# Patient Record
Sex: Male | Born: 2000
Health system: Southern US, Community
[De-identification: ages and names within clinical notes are randomized; demographics above are authoritative.]

## PROBLEM LIST (undated history)

## (undated) DIAGNOSIS — L700 Acne vulgaris: Secondary | ICD-10-CM

## (undated) HISTORY — DX: Acne vulgaris: L70.0

---
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
## 2022-04-17 DIAGNOSIS — M9905 Segmental and somatic dysfunction of pelvic region: Secondary | ICD-10-CM | POA: Diagnosis not present

## 2022-04-17 DIAGNOSIS — M9902 Segmental and somatic dysfunction of thoracic region: Secondary | ICD-10-CM | POA: Diagnosis not present

## 2022-04-17 DIAGNOSIS — M9901 Segmental and somatic dysfunction of cervical region: Secondary | ICD-10-CM | POA: Diagnosis not present

## 2022-04-17 DIAGNOSIS — M9903 Segmental and somatic dysfunction of lumbar region: Secondary | ICD-10-CM | POA: Diagnosis not present

## 2022-04-18 DIAGNOSIS — M9903 Segmental and somatic dysfunction of lumbar region: Secondary | ICD-10-CM | POA: Diagnosis not present

## 2022-04-18 DIAGNOSIS — M9902 Segmental and somatic dysfunction of thoracic region: Secondary | ICD-10-CM | POA: Diagnosis not present

## 2022-04-18 DIAGNOSIS — M9901 Segmental and somatic dysfunction of cervical region: Secondary | ICD-10-CM | POA: Diagnosis not present

## 2022-04-18 DIAGNOSIS — M9905 Segmental and somatic dysfunction of pelvic region: Secondary | ICD-10-CM | POA: Diagnosis not present

## 2022-04-20 DIAGNOSIS — M9902 Segmental and somatic dysfunction of thoracic region: Secondary | ICD-10-CM | POA: Diagnosis not present

## 2022-04-20 DIAGNOSIS — M9903 Segmental and somatic dysfunction of lumbar region: Secondary | ICD-10-CM | POA: Diagnosis not present

## 2022-04-20 DIAGNOSIS — M9901 Segmental and somatic dysfunction of cervical region: Secondary | ICD-10-CM | POA: Diagnosis not present

## 2022-04-20 DIAGNOSIS — M9905 Segmental and somatic dysfunction of pelvic region: Secondary | ICD-10-CM | POA: Diagnosis not present

## 2022-04-23 DIAGNOSIS — M9901 Segmental and somatic dysfunction of cervical region: Secondary | ICD-10-CM | POA: Diagnosis not present

## 2022-04-23 DIAGNOSIS — M9903 Segmental and somatic dysfunction of lumbar region: Secondary | ICD-10-CM | POA: Diagnosis not present

## 2022-04-23 DIAGNOSIS — M9902 Segmental and somatic dysfunction of thoracic region: Secondary | ICD-10-CM | POA: Diagnosis not present

## 2022-04-23 DIAGNOSIS — M9905 Segmental and somatic dysfunction of pelvic region: Secondary | ICD-10-CM | POA: Diagnosis not present

## 2022-04-26 DIAGNOSIS — M9902 Segmental and somatic dysfunction of thoracic region: Secondary | ICD-10-CM | POA: Diagnosis not present

## 2022-04-26 DIAGNOSIS — M9905 Segmental and somatic dysfunction of pelvic region: Secondary | ICD-10-CM | POA: Diagnosis not present

## 2022-04-26 DIAGNOSIS — M9903 Segmental and somatic dysfunction of lumbar region: Secondary | ICD-10-CM | POA: Diagnosis not present

## 2022-04-26 DIAGNOSIS — M9901 Segmental and somatic dysfunction of cervical region: Secondary | ICD-10-CM | POA: Diagnosis not present

## 2022-04-30 DIAGNOSIS — M9903 Segmental and somatic dysfunction of lumbar region: Secondary | ICD-10-CM | POA: Diagnosis not present

## 2022-04-30 DIAGNOSIS — M9901 Segmental and somatic dysfunction of cervical region: Secondary | ICD-10-CM | POA: Diagnosis not present

## 2022-04-30 DIAGNOSIS — M9905 Segmental and somatic dysfunction of pelvic region: Secondary | ICD-10-CM | POA: Diagnosis not present

## 2022-04-30 DIAGNOSIS — M9902 Segmental and somatic dysfunction of thoracic region: Secondary | ICD-10-CM | POA: Diagnosis not present

## 2022-05-03 DIAGNOSIS — M9903 Segmental and somatic dysfunction of lumbar region: Secondary | ICD-10-CM | POA: Diagnosis not present

## 2022-05-03 DIAGNOSIS — M9901 Segmental and somatic dysfunction of cervical region: Secondary | ICD-10-CM | POA: Diagnosis not present

## 2022-05-03 DIAGNOSIS — M9902 Segmental and somatic dysfunction of thoracic region: Secondary | ICD-10-CM | POA: Diagnosis not present

## 2022-05-03 DIAGNOSIS — M9905 Segmental and somatic dysfunction of pelvic region: Secondary | ICD-10-CM | POA: Diagnosis not present

## 2022-05-09 DIAGNOSIS — M9902 Segmental and somatic dysfunction of thoracic region: Secondary | ICD-10-CM | POA: Diagnosis not present

## 2022-05-09 DIAGNOSIS — M9901 Segmental and somatic dysfunction of cervical region: Secondary | ICD-10-CM | POA: Diagnosis not present

## 2022-05-09 DIAGNOSIS — M9903 Segmental and somatic dysfunction of lumbar region: Secondary | ICD-10-CM | POA: Diagnosis not present

## 2022-05-09 DIAGNOSIS — M9905 Segmental and somatic dysfunction of pelvic region: Secondary | ICD-10-CM | POA: Diagnosis not present

## 2022-05-10 DIAGNOSIS — M9905 Segmental and somatic dysfunction of pelvic region: Secondary | ICD-10-CM | POA: Diagnosis not present

## 2022-05-10 DIAGNOSIS — M9901 Segmental and somatic dysfunction of cervical region: Secondary | ICD-10-CM | POA: Diagnosis not present

## 2022-05-10 DIAGNOSIS — M9903 Segmental and somatic dysfunction of lumbar region: Secondary | ICD-10-CM | POA: Diagnosis not present

## 2022-05-10 DIAGNOSIS — M9902 Segmental and somatic dysfunction of thoracic region: Secondary | ICD-10-CM | POA: Diagnosis not present

## 2022-05-14 DIAGNOSIS — M9902 Segmental and somatic dysfunction of thoracic region: Secondary | ICD-10-CM | POA: Diagnosis not present

## 2022-05-14 DIAGNOSIS — M9903 Segmental and somatic dysfunction of lumbar region: Secondary | ICD-10-CM | POA: Diagnosis not present

## 2022-05-14 DIAGNOSIS — M9901 Segmental and somatic dysfunction of cervical region: Secondary | ICD-10-CM | POA: Diagnosis not present

## 2022-05-14 DIAGNOSIS — M9905 Segmental and somatic dysfunction of pelvic region: Secondary | ICD-10-CM | POA: Diagnosis not present

## 2022-05-17 DIAGNOSIS — M9901 Segmental and somatic dysfunction of cervical region: Secondary | ICD-10-CM | POA: Diagnosis not present

## 2022-05-17 DIAGNOSIS — M9905 Segmental and somatic dysfunction of pelvic region: Secondary | ICD-10-CM | POA: Diagnosis not present

## 2022-05-17 DIAGNOSIS — M9903 Segmental and somatic dysfunction of lumbar region: Secondary | ICD-10-CM | POA: Diagnosis not present

## 2022-05-17 DIAGNOSIS — M9902 Segmental and somatic dysfunction of thoracic region: Secondary | ICD-10-CM | POA: Diagnosis not present

## 2022-05-21 DIAGNOSIS — M9903 Segmental and somatic dysfunction of lumbar region: Secondary | ICD-10-CM | POA: Diagnosis not present

## 2022-05-21 DIAGNOSIS — M9905 Segmental and somatic dysfunction of pelvic region: Secondary | ICD-10-CM | POA: Diagnosis not present

## 2022-05-21 DIAGNOSIS — M9902 Segmental and somatic dysfunction of thoracic region: Secondary | ICD-10-CM | POA: Diagnosis not present

## 2022-05-21 DIAGNOSIS — M9901 Segmental and somatic dysfunction of cervical region: Secondary | ICD-10-CM | POA: Diagnosis not present

## 2022-05-23 DIAGNOSIS — M9905 Segmental and somatic dysfunction of pelvic region: Secondary | ICD-10-CM | POA: Diagnosis not present

## 2022-05-23 DIAGNOSIS — M9901 Segmental and somatic dysfunction of cervical region: Secondary | ICD-10-CM | POA: Diagnosis not present

## 2022-05-23 DIAGNOSIS — M9902 Segmental and somatic dysfunction of thoracic region: Secondary | ICD-10-CM | POA: Diagnosis not present

## 2022-05-23 DIAGNOSIS — M9903 Segmental and somatic dysfunction of lumbar region: Secondary | ICD-10-CM | POA: Diagnosis not present

## 2022-05-30 DIAGNOSIS — M9905 Segmental and somatic dysfunction of pelvic region: Secondary | ICD-10-CM | POA: Diagnosis not present

## 2022-05-30 DIAGNOSIS — M9902 Segmental and somatic dysfunction of thoracic region: Secondary | ICD-10-CM | POA: Diagnosis not present

## 2022-05-30 DIAGNOSIS — M9901 Segmental and somatic dysfunction of cervical region: Secondary | ICD-10-CM | POA: Diagnosis not present

## 2022-05-30 DIAGNOSIS — M9903 Segmental and somatic dysfunction of lumbar region: Secondary | ICD-10-CM | POA: Diagnosis not present

## 2022-06-21 DIAGNOSIS — M9901 Segmental and somatic dysfunction of cervical region: Secondary | ICD-10-CM | POA: Diagnosis not present

## 2022-06-21 DIAGNOSIS — M9903 Segmental and somatic dysfunction of lumbar region: Secondary | ICD-10-CM | POA: Diagnosis not present

## 2022-06-21 DIAGNOSIS — M9905 Segmental and somatic dysfunction of pelvic region: Secondary | ICD-10-CM | POA: Diagnosis not present

## 2022-06-21 DIAGNOSIS — M9902 Segmental and somatic dysfunction of thoracic region: Secondary | ICD-10-CM | POA: Diagnosis not present

## 2022-06-22 DIAGNOSIS — M9901 Segmental and somatic dysfunction of cervical region: Secondary | ICD-10-CM | POA: Diagnosis not present

## 2022-06-22 DIAGNOSIS — M9902 Segmental and somatic dysfunction of thoracic region: Secondary | ICD-10-CM | POA: Diagnosis not present

## 2022-06-22 DIAGNOSIS — M9903 Segmental and somatic dysfunction of lumbar region: Secondary | ICD-10-CM | POA: Diagnosis not present

## 2022-06-22 DIAGNOSIS — M9905 Segmental and somatic dysfunction of pelvic region: Secondary | ICD-10-CM | POA: Diagnosis not present

## 2022-06-23 ENCOUNTER — Other Ambulatory Visit: Payer: Self-pay

## 2022-06-23 ENCOUNTER — Emergency Department
Admission: EM | Admit: 2022-06-23 | Discharge: 2022-06-23 | Disposition: A | Discharge status: Home / Self Care | Payer: BC Managed Care – PPO | Attending: Family Medicine | Admitting: Family Medicine

## 2022-06-23 ENCOUNTER — Ambulatory Visit: Payer: Self-pay

## 2022-06-23 DIAGNOSIS — K122 Cellulitis and abscess of mouth: Secondary | ICD-10-CM

## 2022-06-23 LAB — POC SARS CORONAVIRUS 2 AG -  ED: SARS Coronavirus 2 Ag: NEGATIVE

## 2022-06-23 LAB — POCT RAPID STREP A (OFFICE): Rapid Strep A Screen: NEGATIVE

## 2022-06-23 MED ORDER — AMOXICILLIN-POT CLAVULANATE 875-125 MG PO TABS: 1.0000 | ORAL_TABLET | Freq: Two times a day (BID) | ORAL | 0 refills | Status: AC

## 2022-06-23 MED ORDER — PREDNISONE 20 MG PO TABS: ORAL_TABLET | ORAL | 0 refills | Status: DC

## 2022-06-23 NOTE — ED Triage Notes (Signed)
Pt states that he has a sore throat. X3  days  Pt states that he has had other symptoms but they have since resolved.   Pt is vaccinated for covid. Pt isn't vaccinated for flu.  Pt states that he also has a rash on his left leg that popped up before his symptoms and not sure if related.

## 2022-06-23 NOTE — ED Provider Notes (Signed)
[facility]    CSN: [id number] Arrival date & time: 06/23/22  1446      History   Chief Complaint Chief Complaint  Patient presents with   Sore Throat    Sore throat x3 days    HPI Mitchell Daniel is a 21 y.o. male.   HPI 21 year old male presents with sore throat for 3 days.  Is accompanied by his Mother this afternoon.  Past Medical History:  Diagnosis Date   Cystic acne vulgaris    sees Dr. Janalyn Harder     There are no problems to display for this patient.   Past Surgical History:  Procedure Laterality Date   ESOPHAGOGASTRODUODENOSCOPY  08/26/2020   per Dr. Quinn Axe at [place], normal       Home Medications    Prior to Admission medications   Medication Sig Start Date End Date Taking? Authorizing Provider  Acetaminophen (TYLENOL 8 HOUR PO) Take by mouth.   Yes [provider]  amoxicillin-clavulanate (AUGMENTIN) 875-125 MG tablet Take 1 tablet by mouth 2 (two) times daily for 7 days. 06/23/22 06/30/22 Yes Trevor Iha, FNP  IBUPROFEN PO Take by mouth.   Yes [provider]  predniSONE (DELTASONE) 20 MG tablet Take 3 tabs PO daily x 5 days. 06/23/22  Yes Trevor Iha, FNP    Family History Family History  Problem Relation Age of Onset   Scoliosis Father    Scoliosis Sister     Social History Social History   Tobacco Use   Smoking status: Never   Smokeless tobacco: Never  Substance Use Topics   Alcohol use: Never   Drug use: Never     Allergies   Patient has no known allergies.   Review of Systems Review of Systems  HENT:  Positive for sore throat.   All other systems reviewed and are negative.    Physical Exam Triage Vital Signs ED Triage Vitals  Enc Vitals Group     BP      Pulse      Resp      Temp      Temp src      SpO2      Weight      Height      Head Circumference      Peak Flow      Pain Score      Pain Loc      Pain Edu?      Excl. in [place]?    No data found.  Updated  Vital Signs BP 125/78 (BP Location: Right Arm)   Pulse 76   Temp 98.9 F (37.2 C) (Oral)   Resp 20   Ht 6\' 1"  (1.854 m)   Wt 160 lb (72.6 kg)   SpO2 97%   BMI 21.11 kg/m    Physical Exam Vitals and nursing note reviewed.  Constitutional:      Appearance: Normal appearance. He is normal weight.  HENT:     Head: Normocephalic and atraumatic.     Right Ear: Tympanic membrane, ear canal and external ear normal.     Left Ear: Tympanic membrane, ear canal and external ear normal.     Mouth/Throat:     Mouth: Mucous membranes are moist.     Pharynx: Oropharynx is clear. Uvula midline. Posterior oropharyngeal erythema and uvula swelling present.  Eyes:     Extraocular Movements: Extraocular movements intact.     Conjunctiva/sclera: Conjunctivae normal.     Pupils: Pupils are  equal, round, and reactive to light.  Cardiovascular:     Rate and Rhythm: Normal rate and regular rhythm.     Pulses: Normal pulses.     Heart sounds: Normal heart sounds.  Pulmonary:     Effort: Pulmonary effort is normal.     Breath sounds: Normal breath sounds. No wheezing, rhonchi or rales.  Musculoskeletal:     Cervical back: No tenderness.  Lymphadenopathy:     Cervical: No cervical adenopathy.  Skin:    General: Skin is warm and dry.  Neurological:     General: No focal deficit present.     Mental Status: He is alert and oriented to person, place, and time.      UC Treatments / Results  Labs (all labs ordered are listed, but only abnormal results are displayed) Labs Reviewed  POCT RAPID STREP A (OFFICE) - Normal  POC SARS CORONAVIRUS 2 AG -  ED - Normal  CULTURE, GROUP A STREP [facility])    EKG   Radiology No results found.  Procedures Procedures (including critical care time)  Medications Ordered in UC Medications - No data to display  Initial Impression / Assessment and Plan / UC Course  I have reviewed the triage vital signs and the nursing notes.  Pertinent labs & imaging  results that were available during my care of the patient were reviewed by me and considered in my medical decision making (see chart for details).     MDM: 1.  Uvulitis-Rx'd Augmentin, prednisone. Advised patient/Mother strep pharyngitis and COVID-19 were negative.  Advised patient/Mother to take medication as directed with food to completion advised patient to take prednisone with first dose of Augmentin for the next 5 of 7 days.  Encouraged patient increase daily water intake while taking this medications.  If symptoms worsen and/or unresolved please follow-up with PCP or here for further evaluation.  Patient discharged home, hemodynamically stable. Final Clinical Impressions(s) / UC Diagnoses   Final diagnoses:  Uvulitis     Discharge Instructions      Advised patient/Mother strep pharyngitis and COVID-19 were negative.  Advised patient/Mother to take medication as directed with food to completion advised patient to take prednisone with first dose of Augmentin for the next 5 of 7 days.  Encouraged patient increase daily water intake while taking this medications.  If symptoms worsen and/or unresolved please follow-up with PCP or here for further evaluation.     ED Prescriptions     Medication Sig Dispense Auth. Provider   amoxicillin-clavulanate (AUGMENTIN) 875-125 MG tablet Take 1 tablet by mouth 2 (two) times daily for 7 days. 14 tablet Trevor Iha, FNP   predniSONE (DELTASONE) 20 MG tablet Take 3 tabs PO daily x 5 days. 15 tablet Trevor Iha, FNP      PDMP not reviewed this encounter.   Trevor Iha, FNP 06/23/22 1549

## 2022-06-23 NOTE — Discharge Instructions (Addendum)
Advised patient/Mother strep pharyngitis and COVID-19 were negative.  Advised patient/Mother to take medication as directed with food to completion advised patient to take prednisone with first dose of Augmentin for the next 5 of 7 days.  Encouraged patient increase daily water intake while taking this medications.  If symptoms worsen and/or unresolved please follow-up with PCP or here for further evaluation.

## 2022-06-25 DIAGNOSIS — M9903 Segmental and somatic dysfunction of lumbar region: Secondary | ICD-10-CM | POA: Diagnosis not present

## 2022-06-25 DIAGNOSIS — M9901 Segmental and somatic dysfunction of cervical region: Secondary | ICD-10-CM | POA: Diagnosis not present

## 2022-06-25 DIAGNOSIS — M9905 Segmental and somatic dysfunction of pelvic region: Secondary | ICD-10-CM | POA: Diagnosis not present

## 2022-06-25 DIAGNOSIS — M9902 Segmental and somatic dysfunction of thoracic region: Secondary | ICD-10-CM | POA: Diagnosis not present

## 2022-07-09 ENCOUNTER — Telehealth: Payer: Self-pay | Admitting: Emergency Medicine

## 2022-07-09 NOTE — Telephone Encounter (Signed)
LMTRC.  Advised if doing well he can disregard the call, any questions or concerns feel free to give the office a call.

## 2022-09-25 ENCOUNTER — Ambulatory Visit
Admission: RE | Admit: 2022-09-25 | Discharge: 2022-09-25 | Disposition: A | Discharge status: Home / Self Care | Payer: BC Managed Care – PPO | Source: Ambulatory Visit | Attending: Family Medicine | Admitting: Family Medicine

## 2022-09-25 VITALS — BP 132/81 | HR 75 | Temp 98.4°F | Resp 17

## 2022-09-25 DIAGNOSIS — J0100 Acute maxillary sinusitis, unspecified: Secondary | ICD-10-CM | POA: Diagnosis not present

## 2022-09-25 DIAGNOSIS — J3489 Other specified disorders of nose and nasal sinuses: Secondary | ICD-10-CM

## 2022-09-25 MED ORDER — PREDNISONE 20 MG PO TABS: ORAL_TABLET | ORAL | 0 refills | Status: DC

## 2022-09-25 MED ORDER — AMOXICILLIN-POT CLAVULANATE 875-125 MG PO TABS: 1.0000 | ORAL_TABLET | Freq: Two times a day (BID) | ORAL | 0 refills | Status: DC

## 2022-09-25 NOTE — Discharge Instructions (Addendum)
Advised patient to take medication as directed with food to completion.  Advised patient to take prednisone with first dose of Augmentin for the next 5 of 7 days.  Encouraged patient increase daily water intake while taking these medications.  Advised if symptoms worsen and/or unresolved please follow-up with PCP or here for further evaluation.

## 2022-09-25 NOTE — ED Triage Notes (Signed)
Pt c/o sore throat, cough and congestion x 4-5 days. Also intermittent headache. Excedrin and mucinex prn. Denies fever.

## 2022-09-25 NOTE — ED Provider Notes (Signed)
[facility]    CSN: [id number] Arrival date & time: 09/25/22  0941      History   Chief Complaint Chief Complaint  Patient presents with   Sore Throat    APPT 10am   Nasal Congestion   Cough    HPI Mitchell Daniel is a 21 y.o. male.   HPI60 year old male presents with sore throat, sinus pressure, nasal congestion, and cough for 5-6 days.  PMH significant for cystic acne vulgaris.  Past Medical History:  Diagnosis Date   Cystic acne vulgaris    sees Dr. Lavonna Monarch     There are no problems to display for this patient.   Past Surgical History:  Procedure Laterality Date   ESOPHAGOGASTRODUODENOSCOPY  08/26/2020   per Dr. Janus Molder at [place], normal       Home Medications    Prior to Admission medications   Medication Sig Start Date End Date Taking? Authorizing Provider  amoxicillin-clavulanate (AUGMENTIN) 875-125 MG tablet Take 1 tablet by mouth every 12 (twelve) hours. 09/25/22  Yes Eliezer Lofts, FNP  predniSONE (DELTASONE) 20 MG tablet Take 3 tabs PO daily x 5 days. 09/25/22  Yes Eliezer Lofts, FNP  Acetaminophen (TYLENOL 8 HOUR PO) Take by mouth.    [provider]  IBUPROFEN PO Take by mouth.    [provider]    Family History Family History  Problem Relation Age of Onset   Scoliosis Father    Scoliosis Sister     Social History Social History   Tobacco Use   Smoking status: Never   Smokeless tobacco: Never  Substance Use Topics   Alcohol use: Never   Drug use: Never     Allergies   Patient has no known allergies.   Review of Systems Review of Systems  HENT:  Positive for congestion and sore throat.   Respiratory:  Positive for cough.   All other systems reviewed and are negative.    Physical Exam Triage Vital Signs ED Triage Vitals  Enc Vitals Group     BP 09/25/22 1000 132/81     Pulse Rate 09/25/22 1000 75     Resp 09/25/22 1000 17     Temp 09/25/22 1000 98.4 F (36.9 C)      Temp Source 09/25/22 1000 Oral     SpO2 09/25/22 1000 99 %     Weight --      Height --      Head Circumference --      Peak Flow --      Pain Score 09/25/22 1002 6     Pain Loc --      Pain Edu? --      Excl. in [place]? --    No data found.  Updated Vital Signs BP 132/81 (BP Location: Left Arm)   Pulse 75   Temp 98.4 F (36.9 C) (Oral)   Resp 17   SpO2 99%    Physical Exam Vitals and nursing note reviewed.  Constitutional:      Appearance: Normal appearance. He is well-developed and normal weight. He is ill-appearing.  HENT:     Head: Normocephalic and atraumatic.     Right Ear: Tympanic membrane, ear canal and external ear normal.     Left Ear: Tympanic membrane, ear canal and external ear normal.     Nose:     Right Sinus: Maxillary sinus tenderness present.     Left Sinus: Maxillary sinus tenderness present.  Mouth/Throat:     Mouth: Mucous membranes are moist.     Pharynx: Oropharynx is clear. Uvula midline.  Eyes:     Conjunctiva/sclera: Conjunctivae normal.     Pupils: Pupils are equal, round, and reactive to light.  Cardiovascular:     Rate and Rhythm: Normal rate and regular rhythm.     Heart sounds: Normal heart sounds.  Pulmonary:     Effort: Pulmonary effort is normal.     Breath sounds: No wheezing, rhonchi or rales.  Musculoskeletal:     Cervical back: Normal range of motion and neck supple.  Skin:    General: Skin is warm and dry.  Neurological:     General: No focal deficit present.     Mental Status: He is alert and oriented to person, place, and time.      UC Treatments / Results  Labs (all labs ordered are listed, but only abnormal results are displayed) Labs Reviewed - No data to display  EKG   Radiology No results found.  Procedures Procedures (including critical care time)  Medications Ordered in UC Medications - No data to display  Initial Impression / Assessment and Plan / UC Course  I have reviewed the triage vital  signs and the nursing notes.  Pertinent labs & imaging results that were available during my care of the patient were reviewed by me and considered in my medical decision making (see chart for details).     MDM: 1.  Subacute maxillary sinusitis-Rx'd Augmentin; 2.  Sinus pressure-Rx'd prednisone. Advised patient to take medication as directed with food to completion.  Advised patient to take prednisone with first dose of Augmentin for the next 5 of 7 days.  Encourage patient increase daily water intake while taking these medications.  Advised if symptoms worsen and/or unresolved please follow-up with PCP or here for further evaluation.  Work note provided to patient prior to discharge.  Patient discharged home, hemodynamically stable. Final Clinical Impressions(s) / UC Diagnoses   Final diagnoses:  Subacute maxillary sinusitis  Sinus pressure     Discharge Instructions      Advised patient to take medication as directed with food to completion.  Advised patient to take prednisone with first dose of Augmentin for the next 5 of 7 days.  Encouraged patient increase daily water intake while taking these medications.  Advised if symptoms worsen and/or unresolved please follow-up with PCP or here for further evaluation.     ED Prescriptions     Medication Sig Dispense Auth. Provider   amoxicillin-clavulanate (AUGMENTIN) 875-125 MG tablet Take 1 tablet by mouth every 12 (twelve) hours. 14 tablet Trevor Iha, FNP   predniSONE (DELTASONE) 20 MG tablet Take 3 tabs PO daily x 5 days. 15 tablet Trevor Iha, FNP      PDMP not reviewed this encounter.   Trevor Iha, FNP 09/25/22 1117

## 2022-10-26 ENCOUNTER — Encounter: Payer: Self-pay | Admitting: Adult Health

## 2022-10-26 ENCOUNTER — Ambulatory Visit: Payer: BC Managed Care – PPO | Admitting: Adult Health

## 2022-10-26 VITALS — BP 130/76 | HR 72 | Temp 98.0°F | Resp 100 | Wt 164.0 lb

## 2022-10-26 DIAGNOSIS — H6121 Impacted cerumen, right ear: Secondary | ICD-10-CM

## 2022-10-26 NOTE — Progress Notes (Signed)
Subjective:    Patient ID: Mitchell Daniel, male    DOB: 2001-09-02, 21 y.o.   MRN: [id number]  HPI 21 year old male who  has a past medical history of Cystic acne vulgaris.  He presents to the office today for an acute issue of right ear pain x 3-4 days. He denies any drainage or fever.  Pain is described as sharp and pressure like. More noticeable when he yawns.    Review of Systems See HPI   Past Medical History:  Diagnosis Date   Cystic acne vulgaris    sees Dr. Lavonna Monarch     Social History   Socioeconomic History   Marital status: Single    Spouse name: Not on file   Number of children: Not on file   Years of education: Not on file   Highest education level: Not on file  Occupational History   Not on file  Tobacco Use   Smoking status: Never   Smokeless tobacco: Never  Substance and Sexual Activity   Alcohol use: Never   Drug use: Never   Sexual activity: Not on file  Other Topics Concern   Not on file  Social History Narrative   Not on file   Social Determinants of Health   Financial Resource Strain: Not on file  Food Insecurity: Not on file  Transportation Needs: Not on file  Physical Activity: Not on file  Stress: Not on file  Social Connections: Not on file  Intimate Partner Violence: Not on file    Past Surgical History:  Procedure Laterality Date   ESOPHAGOGASTRODUODENOSCOPY  08/26/2020   per Dr. Janus Molder at [place], normal    Family History  Problem Relation Age of Onset   Scoliosis Father    Scoliosis Sister     No Known Allergies  Current Outpatient Medications on File Prior to Visit  Medication Sig Dispense Refill   Acetaminophen (TYLENOL 8 HOUR PO) Take by mouth. (Patient not taking: Reported on 10/26/2022)     IBUPROFEN PO Take by mouth. (Patient not taking: Reported on 10/26/2022)     No current facility-administered medications on file prior to visit.    BP 130/76 (BP Location: Left Arm, Patient Position:  Sitting, Cuff Size: Normal)   Pulse 72   Temp 98 F (36.7 C) (Oral)   Resp (!) 100   Wt 164 lb (74.4 kg)   BMI 21.64 kg/m       Objective:   Physical Exam Vitals and nursing note reviewed.  Constitutional:      Appearance: Normal appearance.  HENT:     Right Ear: Ear canal and external ear normal. There is impacted cerumen.     Left Ear: Tympanic membrane, ear canal and external ear normal. There is no impacted cerumen.  Skin:    General: Skin is warm and dry.     Capillary Refill: Capillary refill takes less than 2 seconds.  Neurological:     General: No focal deficit present.     Mental Status: He is alert and oriented to person, place, and time.  Psychiatric:        Mood and Affect: Mood normal.        Behavior: Behavior normal.        Thought Content: Thought content normal.        Judgment: Judgment normal.       Assessment & Plan:  1. Impacted cerumen of right ear - verbal consent obtained. Warm water was  applied and gentle ear lavage performed to right ear.  There were no complications and following the disimpaction the tympanic membrane was visible.  Tympanic membrane was intact following the procedure.  Auditory canals are normal.  The patient reported relief of symptoms after removal of cerumen. Patient tolerated procedure well.   Shirline Frees, NP

## 2022-11-06 DIAGNOSIS — J029 Acute pharyngitis, unspecified: Secondary | ICD-10-CM | POA: Diagnosis not present

## 2022-11-06 DIAGNOSIS — R0981 Nasal congestion: Secondary | ICD-10-CM | POA: Diagnosis not present

## 2022-11-06 DIAGNOSIS — Z20822 Contact with and (suspected) exposure to covid-19: Secondary | ICD-10-CM | POA: Diagnosis not present

## 2022-11-06 DIAGNOSIS — R051 Acute cough: Secondary | ICD-10-CM | POA: Diagnosis not present

## 2022-12-06 ENCOUNTER — Encounter: Payer: Self-pay | Admitting: Family Medicine

## 2022-12-06 ENCOUNTER — Ambulatory Visit (INDEPENDENT_AMBULATORY_CARE_PROVIDER_SITE_OTHER): Payer: BC Managed Care – PPO | Admitting: Family Medicine

## 2022-12-06 VITALS — BP 108/76 | HR 63 | Temp 97.8°F | Ht 73.5 in | Wt 165.0 lb

## 2022-12-06 DIAGNOSIS — Z0000 Encounter for general adult medical examination without abnormal findings: Secondary | ICD-10-CM | POA: Diagnosis not present

## 2022-12-06 LAB — LIPID PANEL
Cholesterol: 130 mg/dL (ref 0–200)
HDL: 56.8 mg/dL (ref >39.00)
LDL Cholesterol: 62 mg/dL (ref 0–99)
NonHDL: 73
Total CHOL/HDL Ratio: 2
Triglycerides: 55 mg/dL (ref 0.0–149.0)
VLDL: 11 mg/dL (ref 0.0–40.0)

## 2022-12-06 LAB — CBC WITH DIFFERENTIAL/PLATELET
Basophils Absolute: 0 10*3/uL (ref 0.0–0.1)
Basophils Relative: 0.8 % (ref 0.0–3.0)
Eosinophils Absolute: 0.1 10*3/uL (ref 0.0–0.7)
Eosinophils Relative: 2.1 % (ref 0.0–5.0)
HCT: 44.3 % (ref 36.0–49.0)
Hemoglobin: 15.4 g/dL (ref 12.0–16.0)
Lymphocytes Relative: 38.3 % (ref 24.0–48.0)
Lymphs Abs: 1.8 10*3/uL (ref 0.7–4.0)
MCHC: 34.7 g/dL (ref 31.0–37.0)
MCV: 87.5 fl (ref 78.0–98.0)
Monocytes Absolute: 0.4 10*3/uL (ref 0.1–1.0)
Monocytes Relative: 8.4 % (ref 3.0–12.0)
Neutro Abs: 2.4 10*3/uL (ref 1.4–7.7)
Neutrophils Relative %: 50.4 % (ref 43.0–71.0)
Platelets: 209 10*3/uL (ref 150.0–575.0)
RBC: 5.07 Mil/uL (ref 3.80–5.70)
RDW: 13.2 % (ref 11.4–15.5)
WBC: 4.7 10*3/uL (ref 4.5–13.5)

## 2022-12-06 LAB — BASIC METABOLIC PANEL
BUN: 15 mg/dL (ref 6–23)
CO2: 29 mEq/L (ref 19–32)
Calcium: 9.7 mg/dL (ref 8.4–10.5)
Chloride: 104 mEq/L (ref 96–112)
Creatinine, Ser: 0.98 mg/dL (ref 0.40–1.50)
GFR: 111.73 mL/min (ref >60.00)
Glucose, Bld: 98 mg/dL (ref 70–99)
Potassium: 3.9 mEq/L (ref 3.5–5.1)
Sodium: 140 mEq/L (ref 135–145)

## 2022-12-06 LAB — HEPATIC FUNCTION PANEL
ALT: 25 U/L (ref 0–53)
AST: 20 U/L (ref 0–37)
Albumin: 4.8 g/dL (ref 3.5–5.2)
Alkaline Phosphatase: 56 U/L (ref 52–171)
Bilirubin, Direct: 0.2 mg/dL (ref 0.0–0.3)
Total Bilirubin: 0.8 mg/dL (ref 0.2–1.2)
Total Protein: 7.5 g/dL (ref 6.0–8.3)

## 2022-12-06 LAB — HEMOGLOBIN A1C: Hgb A1c MFr Bld: 5.6 % (ref 4.6–6.5)

## 2022-12-06 NOTE — Progress Notes (Signed)
   Subjective:    Patient ID: Mitchell Daniel, male    DOB: 18-Aug-2001, 21 y.o.   MRN: [id number]  HPI Here for a well exam. He feels fine. He is working full time at a [employer].     Review of Systems  Constitutional: Negative.   HENT: Negative.    Eyes: Negative.   Respiratory: Negative.    Cardiovascular: Negative.   Gastrointestinal: Negative.   Genitourinary: Negative.   Musculoskeletal: Negative.   Skin: Negative.   Neurological: Negative.   Psychiatric/Behavioral: Negative.         Objective:   Physical Exam Constitutional:      General: He is not in acute distress.    Appearance: Normal appearance. He is well-developed. He is not diaphoretic.  HENT:     Head: Normocephalic and atraumatic.     Right Ear: External ear normal.     Left Ear: External ear normal.     Nose: Nose normal.     Mouth/Throat:     Pharynx: No oropharyngeal exudate.  Eyes:     General: No scleral icterus.       Right eye: No discharge.        Left eye: No discharge.     Conjunctiva/sclera: Conjunctivae normal.     Pupils: Pupils are equal, round, and reactive to light.  Neck:     Thyroid: No thyromegaly.     Vascular: No JVD.     Trachea: No tracheal deviation.  Cardiovascular:     Rate and Rhythm: Normal rate and regular rhythm.     Heart sounds: Normal heart sounds. No murmur heard.    No friction rub. No gallop.  Pulmonary:     Effort: Pulmonary effort is normal. No respiratory distress.     Breath sounds: Normal breath sounds. No wheezing or rales.  Chest:     Chest wall: No tenderness.  Abdominal:     General: Bowel sounds are normal. There is no distension.     Palpations: Abdomen is soft. There is no mass.     Tenderness: There is no abdominal tenderness. There is no guarding or rebound.  Genitourinary:    Penis: Normal. No tenderness.      Testes: Normal.  Musculoskeletal:        General: No tenderness. Normal range of motion.     Cervical back: Neck supple.   Lymphadenopathy:     Cervical: No cervical adenopathy.  Skin:    General: Skin is warm and dry.     Coloration: Skin is not pale.     Findings: No erythema or rash.  Neurological:     Mental Status: He is alert and oriented to person, place, and time.     Cranial Nerves: No cranial nerve deficit.     Motor: No abnormal muscle tone.     Coordination: Coordination normal.     Deep Tendon Reflexes: Reflexes are normal and symmetric. Reflexes normal.  Psychiatric:        Behavior: Behavior normal.        Thought Content: Thought content normal.        Judgment: Judgment normal.           Assessment & Plan:  Well exam. We discussed diet and exercise. Get fasting labs. Gershon Crane, MD

## 2022-12-07 LAB — TSH: TSH: 1.59 u[IU]/mL (ref 0.40–5.00)

## 2022-12-24 DIAGNOSIS — Z20828 Contact with and (suspected) exposure to other viral communicable diseases: Secondary | ICD-10-CM | POA: Diagnosis not present

## 2022-12-24 DIAGNOSIS — J069 Acute upper respiratory infection, unspecified: Secondary | ICD-10-CM | POA: Diagnosis not present

## 2022-12-24 DIAGNOSIS — R6883 Chills (without fever): Secondary | ICD-10-CM | POA: Diagnosis not present

## 2023-02-18 DIAGNOSIS — H1033 Unspecified acute conjunctivitis, bilateral: Secondary | ICD-10-CM | POA: Diagnosis not present

## 2023-02-18 DIAGNOSIS — J069 Acute upper respiratory infection, unspecified: Secondary | ICD-10-CM | POA: Diagnosis not present

## 2023-04-03 ENCOUNTER — Telehealth: Payer: Self-pay | Admitting: Family Medicine

## 2023-04-03 DIAGNOSIS — Z8379 Family history of other diseases of the digestive system: Secondary | ICD-10-CM

## 2023-04-03 NOTE — Telephone Encounter (Signed)
Done

## 2024-02-13 ENCOUNTER — Encounter: Payer: Self-pay | Admitting: Family Medicine

## 2024-02-13 ENCOUNTER — Ambulatory Visit: Payer: BC Managed Care – PPO | Admitting: Family Medicine

## 2024-02-13 VITALS — BP 118/70 | HR 82 | Temp 97.9°F | Ht 73.0 in | Wt 160.6 lb

## 2024-02-13 DIAGNOSIS — Z0000 Encounter for general adult medical examination without abnormal findings: Secondary | ICD-10-CM | POA: Diagnosis not present

## 2024-02-13 DIAGNOSIS — Z13220 Encounter for screening for lipoid disorders: Secondary | ICD-10-CM

## 2024-02-13 DIAGNOSIS — Z8379 Family history of other diseases of the digestive system: Secondary | ICD-10-CM | POA: Diagnosis not present

## 2024-02-13 LAB — CBC WITH DIFFERENTIAL/PLATELET
Basophils Absolute: 0 10*3/uL (ref 0.0–0.1)
Basophils Relative: 0.7 % (ref 0.0–3.0)
Eosinophils Absolute: 0.1 10*3/uL (ref 0.0–0.7)
Eosinophils Relative: 1.3 % (ref 0.0–5.0)
HCT: 44.5 % (ref 39.0–52.0)
Hemoglobin: 15.3 g/dL (ref 13.0–17.0)
Lymphocytes Relative: 32.4 % (ref 12.0–46.0)
Lymphs Abs: 1.4 10*3/uL (ref 0.7–4.0)
MCHC: 34.4 g/dL (ref 30.0–36.0)
MCV: 89.2 fl (ref 78.0–100.0)
Monocytes Absolute: 0.3 10*3/uL (ref 0.1–1.0)
Monocytes Relative: 7.7 % (ref 3.0–12.0)
Neutro Abs: 2.4 10*3/uL (ref 1.4–7.7)
Neutrophils Relative %: 57.9 % (ref 43.0–77.0)
Platelets: 229 10*3/uL (ref 150.0–400.0)
RBC: 4.99 Mil/uL (ref 4.22–5.81)
RDW: 13 % (ref 11.5–14.6)
WBC: 4.2 10*3/uL — ABNORMAL LOW (ref 4.5–10.5)

## 2024-02-13 LAB — BASIC METABOLIC PANEL
BUN: 17 mg/dL (ref 6–23)
CO2: 28 meq/L (ref 19–32)
Calcium: 9.7 mg/dL (ref 8.4–10.5)
Chloride: 103 meq/L (ref 96–112)
Creatinine, Ser: 0.88 mg/dL (ref 0.40–1.50)
GFR: 123.56 mL/min (ref >60.00)
Glucose, Bld: 108 mg/dL — ABNORMAL HIGH (ref 70–99)
Potassium: 3.7 meq/L (ref 3.5–5.1)
Sodium: 139 meq/L (ref 135–145)

## 2024-02-13 LAB — HEPATIC FUNCTION PANEL
ALT: 43 U/L (ref 0–53)
AST: 25 U/L (ref 0–37)
Albumin: 4.7 g/dL (ref 3.5–5.2)
Alkaline Phosphatase: 49 U/L (ref 39–117)
Bilirubin, Direct: 0.2 mg/dL (ref 0.0–0.3)
Total Bilirubin: 0.8 mg/dL (ref 0.2–1.2)
Total Protein: 7.3 g/dL (ref 6.0–8.3)

## 2024-02-13 LAB — HEMOGLOBIN A1C: Hgb A1c MFr Bld: 5.4 % (ref 4.6–6.5)

## 2024-02-13 LAB — TSH: TSH: 0.95 u[IU]/mL (ref 0.35–5.50)

## 2024-02-13 LAB — LIPID PANEL
Cholesterol: 159 mg/dL (ref 0–200)
HDL: 62.8 mg/dL (ref >39.00)
LDL Cholesterol: 89 mg/dL (ref 0–99)
NonHDL: 95.95
Total CHOL/HDL Ratio: 3
Triglycerides: 33 mg/dL (ref 0.0–149.0)
VLDL: 6.6 mg/dL (ref 0.0–40.0)

## 2024-02-13 NOTE — Progress Notes (Signed)
   Subjective:    Patient ID: Mitchell Daniel, male    DOB: 2001-08-15, 22 y.o.   MRN: [id number]  HPI Here for a well exam. He feels great.    Review of Systems  Constitutional: Negative.   HENT: Negative.    Eyes: Negative.   Respiratory: Negative.    Cardiovascular: Negative.   Gastrointestinal: Negative.   Genitourinary: Negative.   Musculoskeletal: Negative.   Skin: Negative.   Neurological: Negative.   Psychiatric/Behavioral: Negative.         Objective:   Physical Exam Constitutional:      General: He is not in acute distress.    Appearance: Normal appearance. He is well-developed. He is not diaphoretic.  HENT:     Head: Normocephalic and atraumatic.     Right Ear: External ear normal.     Left Ear: External ear normal.     Nose: Nose normal.     Mouth/Throat:     Pharynx: No oropharyngeal exudate.  Eyes:     General: No scleral icterus.       Right eye: No discharge.        Left eye: No discharge.     Conjunctiva/sclera: Conjunctivae normal.     Pupils: Pupils are equal, round, and reactive to light.  Neck:     Thyroid: No thyromegaly.     Vascular: No JVD.     Trachea: No tracheal deviation.  Cardiovascular:     Rate and Rhythm: Normal rate and regular rhythm.     Pulses: Normal pulses.     Heart sounds: Normal heart sounds. No murmur heard.    No friction rub. No gallop.  Pulmonary:     Effort: Pulmonary effort is normal. No respiratory distress.     Breath sounds: Normal breath sounds. No wheezing or rales.  Chest:     Chest wall: No tenderness.  Abdominal:     General: Bowel sounds are normal. There is no distension.     Palpations: Abdomen is soft. There is no mass.     Tenderness: There is no abdominal tenderness. There is no guarding or rebound.  Genitourinary:    Penis: Normal. No tenderness.      Testes: Normal.  Musculoskeletal:        General: No tenderness. Normal range of motion.     Cervical back: Neck supple.  Lymphadenopathy:      Cervical: No cervical adenopathy.  Skin:    General: Skin is warm and dry.     Coloration: Skin is not pale.     Findings: No erythema or rash.  Neurological:     General: No focal deficit present.     Mental Status: He is alert and oriented to person, place, and time.     Cranial Nerves: No cranial nerve deficit.     Motor: No abnormal muscle tone.     Coordination: Coordination normal.     Deep Tendon Reflexes: Reflexes are normal and symmetric. Reflexes normal.  Psychiatric:        Mood and Affect: Mood normal.        Behavior: Behavior normal.        Thought Content: Thought content normal.        Judgment: Judgment normal.           Assessment & Plan:  Well exam. We discussed diet and exercise. Get fasting labs. Gershon Crane, MD

## 2024-02-14 LAB — ANA: Anti Nuclear Antibody (ANA): NEGATIVE

## 2024-04-27 ENCOUNTER — Ambulatory Visit: Payer: Self-pay

## 2024-04-27 DIAGNOSIS — B349 Viral infection, unspecified: Secondary | ICD-10-CM | POA: Diagnosis not present

## 2024-04-27 DIAGNOSIS — R079 Chest pain, unspecified: Secondary | ICD-10-CM | POA: Diagnosis not present

## 2024-04-27 DIAGNOSIS — R0602 Shortness of breath: Secondary | ICD-10-CM | POA: Diagnosis not present

## 2024-04-27 DIAGNOSIS — Z87891 Personal history of nicotine dependence: Secondary | ICD-10-CM | POA: Diagnosis not present

## 2024-04-27 DIAGNOSIS — R5383 Other fatigue: Secondary | ICD-10-CM | POA: Diagnosis not present

## 2024-04-27 DIAGNOSIS — R0789 Other chest pain: Secondary | ICD-10-CM | POA: Diagnosis not present

## 2024-04-27 DIAGNOSIS — M791 Myalgia, unspecified site: Secondary | ICD-10-CM | POA: Diagnosis not present

## 2024-04-27 DIAGNOSIS — I498 Other specified cardiac arrhythmias: Secondary | ICD-10-CM | POA: Diagnosis not present

## 2024-04-27 DIAGNOSIS — R6883 Chills (without fever): Secondary | ICD-10-CM | POA: Diagnosis not present

## 2024-04-28 ENCOUNTER — Encounter: Payer: Self-pay | Admitting: Family Medicine

## 2024-04-28 ENCOUNTER — Ambulatory Visit: Admitting: Family Medicine

## 2024-04-28 VITALS — BP 110/72 | HR 78 | Temp 99.1°F | Wt 150.0 lb

## 2024-04-28 DIAGNOSIS — B349 Viral infection, unspecified: Secondary | ICD-10-CM

## 2024-04-28 DIAGNOSIS — F411 Generalized anxiety disorder: Secondary | ICD-10-CM

## 2024-04-28 NOTE — Progress Notes (Signed)
 Subjective:    Patient ID: Mitchell Daniel, male    DOB: [birth year], 22 y.o.   MRN: [id number]  HPI Here with his father to follow up a visit to a [facility] ED yesterday. He presented with several hours of body aches, chills without fever, headaches, SOB, chest pain, tingling in the left arm, and a dry cough. His exam was normal. EKG and CXR were normal. Labs were normal. He tested negative for flu, Covid, and RSV. He was diagnosed with a viral infection, and he was told to rest, drink fluids, and take Tylenol as needed. Today he feels much better. All these symptoms have resolved, but he does have a new symptom of sore throat. In addition, he and his father describe Mitchell Daniel as going through a lot of stress recently. 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 He has been sleeping in a hotel room, and he admits that he has been sleeping very poorly. He is getting no exercise, and he has been eating very poorly. His father notes that he has lost 10 lbs in the past 4 weeks.    Review of Systems  Constitutional:  Positive for fatigue. Negative for fever.  HENT:  Positive for sore throat. Negative for congestion, ear pain, postnasal drip and sinus pressure.   Eyes: Negative.   Respiratory: Negative.    Cardiovascular: Negative.   Gastrointestinal: Negative.   Neurological:  Negative for headaches.  Psychiatric/Behavioral:  Positive for sleep disturbance. Negative for agitation, behavioral problems, confusion, decreased concentration, dysphoric mood and hallucinations. The patient is nervous/anxious.        Objective:   Physical Exam Constitutional:      Appearance: Normal appearance.  HENT:     Right Ear: Tympanic membrane, ear canal and external ear normal.     Left Ear: Tympanic membrane, ear canal and external ear normal.     Nose: Nose normal.     Mouth/Throat:     Pharynx: Oropharynx is clear.  Eyes:     Conjunctiva/sclera:  Conjunctivae normal.  Cardiovascular:     Rate and Rhythm: Normal rate and regular rhythm.     Pulses: Normal pulses.     Heart sounds: Normal heart sounds.  Pulmonary:     Effort: Pulmonary effort is normal.     Breath sounds: Normal breath sounds.  Chest:     Chest wall: No tenderness.  Lymphadenopathy:     Cervical: No cervical adenopathy.  Neurological:     Mental Status: He is alert and oriented to person, place, and time. Mental status is at baseline.  Psychiatric:        Behavior: Behavior normal.        Thought Content: Thought content normal.     Comments: He is mildly anxious            Assessment & Plan:  He has been dealing with a viral illness, and this was likely brought on by his stress and poor diet and lack of sleep. He has started taking melatonin at bedtime, and this has helped him sleep. The viral illness should resolve on its own in the next few days. We discussed the importance of getting adequate sleep and of eating a well balanced diet. For stress management, I recommended he get some daily exercise. I asked him to come back in one month for us  to follow up on these issues. He may require a  medication for his anxiety at some point, but we will see how he does. We spent a total of (35   ) minutes reviewing records and discussing these issues.  Mitchell Diego, MD

## 2024-04-29 ENCOUNTER — Ambulatory Visit: Admitting: Family Medicine

## 2024-05-28 ENCOUNTER — Ambulatory Visit: Admitting: Family Medicine

## 2024-06-04 ENCOUNTER — Ambulatory Visit: Admitting: Family Medicine

## 2024-06-04 ENCOUNTER — Encounter: Payer: Self-pay | Admitting: Family Medicine

## 2024-06-04 VITALS — BP 110/76 | HR 71 | Temp 97.5°F | Wt 158.0 lb

## 2024-06-04 DIAGNOSIS — F419 Anxiety disorder, unspecified: Secondary | ICD-10-CM

## 2024-06-04 DIAGNOSIS — Z8619 Personal history of other infectious and parasitic diseases: Secondary | ICD-10-CM

## 2024-06-04 NOTE — Progress Notes (Signed)
   Subjective:    Patient ID: Mitchell Daniel, male    DOB: 12/27/2000, 23 y.o.   MRN: [id number]  HPI Here to follow up on a viral illness and to discuss his stress levels. He had been dealing with body aches and a ST, but these symptoms have completely resolved. At that time he was under a lot of stress with his job and he was temporarily working in [place]. At that time he couldn't eat or sleep. Now he is back in [place], and he is eating and sleeping normally. He has regained a little weight.    Review of Systems  Constitutional: Negative.   Respiratory: Negative.    Cardiovascular: Negative.   Gastrointestinal: Negative.   Psychiatric/Behavioral: Negative.         Objective:   Physical Exam Constitutional:      Appearance: Normal appearance.   Cardiovascular:     Rate and Rhythm: Normal rate and regular rhythm.     Pulses: Normal pulses.     Heart sounds: Normal heart sounds.  Pulmonary:     Effort: Pulmonary effort is normal.     Breath sounds: Normal breath sounds.   Neurological:     Mental Status: He is alert.   Psychiatric:        Mood and Affect: Mood normal.        Behavior: Behavior normal.        Thought Content: Thought content normal.           Assessment & Plan:  He has recovered from a viral illness, and his anxiety is under good control. He will follow up as needed.  Garnette Olmsted, MD

## 2024-06-25 ENCOUNTER — Ambulatory Visit: Payer: Self-pay

## 2024-06-25 NOTE — Telephone Encounter (Signed)
 FYI Only or Action Required?: FYI only for provider.  Patient was last seen in primary care on 06/04/2024 by Mitchell Daniel LABOR, MD.  Called Nurse Triage reporting Covid Positive.  Symptoms began yesterday.  Interventions attempted: Nothing.  Symptoms are: gradually improving.  Triage Disposition: Home Care  Patient/caregiver understands and will follow disposition?: Yes            Copied from CRM [id number]. Topic: Clinical - Red Word Triage >> Jun 25, 2024  2:23 PM Jayma L wrote: Red Word that prompted transfer to Nurse Triage: patient called in stated he tested positive for covid, said he is having headaches, sore throat, body aches, fatigue and extremely tired. Been going on over 24 hours. Slept for about 12 hours and been in bed all day, he was around someone else who has covid. Reason for Disposition  [1] COVID-19 diagnosed by positive lab test (e.g., PCR, rapid self-test kit) AND [2] mild symptoms (e.g., cough, fever, others) AND [3] no complications or SOB  Answer Assessment - Initial Assessment Questions 1. SYMPTOMS: What is your main symptom or concern? (e.g., cough, fever, shortness of breath, muscle aches)     bodyache 2. ONSET: When did the symptoms start?      About 24 hrs go 3. COUGH: Do you have a cough? If Yes, ask: How bad is the cough?       no 4. FEVER: Do you have a fever? If Yes, ask: What is your temperature, how was it measured, and when did it start?     no 5. BREATHING DIFFICULTY: Are you having any difficulty breathing? (e.g., normal; shortness of breath, wheezing, unable to speak)      no 6. BETTER-SAME-WORSE: Are you getting better, staying the same or getting worse compared to yesterday?  If getting worse, ask, In what way?     better 7. OTHER SYMPTOMS: Do you have any other symptoms?  (e.g., chills, fatigue, headache, loss of smell or taste, muscle pain, sore throat)     Headache, fatigue, sore throat 8. COVID-19 DIAGNOSIS:  How do you know that you have COVID? (e.g., positive lab test or self-test, diagnosed by doctor or NP/PA, symptoms after exposure).     yes 9. COVID-19 EXPOSURE: Was there any known exposure to COVID before the symptoms began?      Unsure-sister has it 10. COVID-19 VACCINE: Have you had the COVID-19 vaccine? If Yes, ask: When did you last get it?       2021 11. HIGH RISK DISEASE: Do you have any chronic medical problems? (e.g., asthma, heart or lung disease, weak immune system, obesity, etc.)       no  Protocols used: COVID-19 - Diagnosed or Suspected-A-AH

## 2024-06-26 ENCOUNTER — Other Ambulatory Visit: Payer: Self-pay | Admitting: Family Medicine

## 2024-06-26 MED ORDER — NIRMATRELVIR/RITONAVIR (PAXLOVID)TABLET: 3.0000 | ORAL_TABLET | Freq: Two times a day (BID) | ORAL | 0 refills | Status: AC

## 2024-06-26 MED ORDER — NIRMATRELVIR/RITONAVIR (PAXLOVID)TABLET: 3.0000 | ORAL_TABLET | Freq: Two times a day (BID) | ORAL | 0 refills | Status: DC

## 2024-06-26 NOTE — Telephone Encounter (Signed)
>>   Jun 25, 2024  2:23 PM Jayma L wrote: Red Word that prompted transfer to Nurse Triage: patient called in stated he tested positive for covid, said he is having headaches, sore throat, body aches, fatigue and extremely tired. Been going on over 24 hours. Slept for about 12 hours and been in bed all day, he was around someone else who has covid. Reason for Disposition  [1] COVID-19 diagnosed by positive lab test (e.g., PCR, rapid self-test kit) AND [2] mild symptoms (e.g., cough, fever, others) AND [3] no complications or SOB

## 2024-06-26 NOTE — Telephone Encounter (Signed)
 I sent in a RX for Paxlovid to treat the Covid

## 2024-06-26 NOTE — Addendum Note (Signed)
 Addended by: JOHNNY SENIOR A on: 06/26/2024 04:30 PM   Modules accepted: Orders

## 2024-06-26 NOTE — Telephone Encounter (Signed)
 Done
